# Patient Record
Sex: Male | Born: 2000 | Race: White | Hispanic: No | Marital: Single | Smoking: Never smoker
Health system: Southern US, Community
[De-identification: ages and names within clinical notes are randomized; demographics above are authoritative.]

## PROBLEM LIST (undated history)

## (undated) HISTORY — PX: APPENDECTOMY: SHX54

---
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
## 2022-05-01 ENCOUNTER — Encounter: Payer: Self-pay | Admitting: Family

## 2022-05-01 ENCOUNTER — Telehealth (INDEPENDENT_AMBULATORY_CARE_PROVIDER_SITE_OTHER): Payer: Medicaid Other | Admitting: Family

## 2022-05-01 DIAGNOSIS — R6883 Chills (without fever): Secondary | ICD-10-CM

## 2022-05-01 DIAGNOSIS — R519 Headache, unspecified: Secondary | ICD-10-CM

## 2022-05-01 DIAGNOSIS — R6889 Other general symptoms and signs: Secondary | ICD-10-CM

## 2022-05-01 DIAGNOSIS — R059 Cough, unspecified: Secondary | ICD-10-CM | POA: Diagnosis not present

## 2022-05-01 MED ORDER — OSELTAMIVIR PHOSPHATE 75 MG PO CAPS: 75.0000 mg | ORAL_CAPSULE | Freq: Two times a day (BID) | ORAL | 0 refills | Status: DC

## 2022-05-01 MED ORDER — BENZONATATE 200 MG PO CAPS: 200.0000 mg | ORAL_CAPSULE | Freq: Three times a day (TID) | ORAL | 1 refills | Status: DC | PRN

## 2022-05-01 NOTE — Progress Notes (Signed)
Virtual Visit Consent   Laurian Brim, you are scheduled for a virtual visit with a [facility] provider today. Just as with appointments in the office, your consent must be obtained to participate. Your consent will be active for this visit and any virtual visit you may have with one of our providers in the next 365 days. If you have a MyChart account, a copy of this consent can be sent to you electronically.  As this is a virtual visit, video technology does not allow for your provider to perform a traditional examination. This may limit your provider's ability to fully assess your condition. If your provider identifies any concerns that need to be evaluated in person or the need to arrange testing (such as labs, EKG, etc.), we will make arrangements to do so. Although advances in technology are sophisticated, we cannot ensure that it will always work on either your end or our end. If the connection with a video visit is poor, the visit may have to be switched to a telephone visit. With either a video or telephone visit, we are not always able to ensure that we have a secure connection.  By engaging in this virtual visit, you consent to the provision of healthcare and authorize for your insurance to be billed (if applicable) for the services provided during this visit. Depending on your insurance coverage, you may receive a charge related to this service.  I need to obtain your verbal consent now. Are you willing to proceed with your visit today? Cody Montgomery has provided verbal consent on 05/01/2022 for a virtual visit (video or telephone). Jannifer Rodney, FNP  Date: 05/01/2022 12:59 PM  Virtual Visit via Video Note   I, Jannifer Rodney, connected with  Cody Montgomery  ([id number], Sep 25, 2000) on 05/01/22 at  1:35 PM EST by a video-enabled telemedicine application and verified that I am speaking with the correct person using two identifiers.  Location: Patient: Virtual Visit Location Patient:  Home Provider: Virtual Visit Location Provider: Home Office   I discussed the limitations of evaluation and management by telemedicine and the availability of in person appointments. The patient expressed understanding and agreed to proceed.    History of Present Illness: Cody Montgomery is a 21 y.o. who identifies as a male who was assigned male at birth, and is being seen today for cough, congestion, and headache that started two days ago.  HPI: Cough This is a new problem. The current episode started in the past 7 days. The problem has been gradually worsening. The problem occurs every few minutes. The cough is Productive of sputum. Associated symptoms include chills, headaches, nasal congestion and rhinorrhea. Pertinent negatives include no ear congestion, ear pain, fever, myalgias, shortness of breath or wheezing. He has tried rest and OTC cough suppressant for the symptoms. The treatment provided mild relief.    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 For panic, Disp: 30 tablet, Rfl: 5   Vitamin D, Ergocalciferol, (DRISDOL) 1.25 MG  (50000 UNIT) CAPS capsule, Take 1 capsule (50,000 Units total) by mouth every 7 (seven) days., Disp: 13 capsule, Rfl: 3  Observations/Objective: Patient is well-developed, well-nourished in no acute distress.  Resting comfortably  at home.  Head is normocephalic, atraumatic.  No labored breathing.  Speech is clear and coherent with logical content.  Patient is alert and oriented at baseline.  Nasal congestion   Assessment and Plan: 1. Flu-like symptoms - oseltamivir (TAMIFLU) 75 MG capsule; Take 1 capsule (75 mg total) by mouth 2 (two) times daily.  Dispense: 10 capsule; Refill: 0 - benzonatate (TESSALON) 200 MG capsule; Take 1 capsule (200 mg total) by mouth 3 (three) times daily as needed.  Dispense: 30 capsule; Refill: 1  Force fluids  Rest Alternate motrin and tylenol  Droplet precautions  Follow up if symptoms worsen or do not improve   Follow Up Instructions: I discussed the assessment and treatment plan with the patient. The patient was provided an opportunity to ask questions and all were answered. The patient agreed with the plan and demonstrated an understanding of the instructions.  A copy of instructions were sent to the patient via MyChart unless otherwise noted below.    The patient was advised to call back or seek an in-person evaluation if the symptoms worsen or if the condition fails to improve as anticipated.  Time:  I spent 7 minutes with the patient via telehealth technology discussing the above problems/concerns.    Jannifer Rodney, FNP

## 2022-05-04 ENCOUNTER — Ambulatory Visit: Payer: Medicaid Other | Admitting: Family Medicine

## 2022-07-28 DIAGNOSIS — K047 Periapical abscess without sinus: Secondary | ICD-10-CM | POA: Diagnosis not present

## 2022-12-03 ENCOUNTER — Ambulatory Visit (INDEPENDENT_AMBULATORY_CARE_PROVIDER_SITE_OTHER): Payer: Medicaid Other | Admitting: Family Medicine

## 2022-12-03 ENCOUNTER — Other Ambulatory Visit: Payer: Medicaid Other

## 2022-12-03 VITALS — BP 133/72 | HR 88 | Temp 98.3°F | Ht 69.0 in | Wt 196.6 lb

## 2022-12-03 DIAGNOSIS — R000 Tachycardia, unspecified: Secondary | ICD-10-CM

## 2022-12-03 NOTE — Progress Notes (Signed)
Subjective:  Patient ID: Cody Montgomery, male    DOB: [birth year]  Age: 22 y.o. MRN: [id number]  CC: Tachycardia and Dizziness   HPI Cody Montgomery presents for palpitations daily, usually in the evening. Onset about 1 month ago. One episode. Sitting on bed, got up, everything got dark, tunnel vision. Asted a few moments. Had palpitations with that. Frequently the palpitations are accompanied by this, but they do make him feel odd. He drinks a lot of water. Works at [employer]. Drinks 9-10 bottles a day.      12/03/2022   11:49 AM 12/03/2022   11:30 AM 11/08/2021    3:29 PM  Depression screen PHQ 2/9  Decreased Interest 0 0 1  Down, Depressed, Hopeless 0 0 2  PHQ - 2 Score 0 0 3  Altered sleeping 3  3  Tired, decreased energy 1  0  Change in appetite 3  2  Feeling bad or failure about yourself  0  3  Trouble concentrating 0  0  Moving slowly or fidgety/restless 0  0  Suicidal thoughts 0  0  PHQ-9 Score 7  11  Difficult doing work/chores Somewhat difficult  Somewhat difficult    History Cody Montgomery has no past medical history on file.   He has a past surgical history that includes Appendectomy.   His family history includes Depression in his mother; Drug abuse in his father; Hypertension in his mother.He reports that he has never smoked. He has been exposed to tobacco smoke. He has never used smokeless tobacco. He reports that he does not drink alcohol and does not use drugs.    ROS Review of Systems  Constitutional:  Negative for activity change and fever.  Respiratory:  Negative for shortness of breath.   Cardiovascular:  Positive for chest pain (odd sensation only) and palpitations.  Musculoskeletal:  Negative for arthralgias.  Skin:  Negative for rash.  Psychiatric/Behavioral:  Negative for sleep disturbance.     Objective:  BP 133/72   Pulse 88   Temp 98.3 F (36.8 C)   Ht 5\' 9"  (1.753 m)   Wt 196 lb 9.6 oz (89.2 kg)   SpO2 99%   BMI 29.03  kg/m   BP Readings from Last 3 Encounters:  12/03/22 133/72  11/08/21 137/79  09/22/20 (!) 141/86 (97%, Z = 1.88 /  96%, Z = 1.75)*   *BP percentiles are based on the 2017 AAP Clinical Practice Guideline for boys    Wt Readings from Last 3 Encounters:  12/03/22 196 lb 9.6 oz (89.2 kg)  11/08/21 189 lb (85.7 kg) (89%, Z= 1.21)*  09/22/20 (!) 261 lb 9.6 oz (118.7 kg) (>99%, Z= 2.65)*   * Growth percentiles are based on CDC (Boys, 2-20 Years) data.     Physical Exam Constitutional:      General: He is not in acute distress.    Appearance: He is well-developed.  HENT:     Head: Normocephalic and atraumatic.     Right Ear: External ear normal.     Left Ear: External ear normal.     Nose: Nose normal.  Eyes:     Conjunctiva/sclera: Conjunctivae normal.     Pupils: Pupils are equal, round, and reactive to light.  Cardiovascular:     Rate and Rhythm: Normal rate and regular rhythm.     Heart sounds: Normal heart sounds. No murmur heard. Pulmonary:     Effort: Pulmonary effort is normal. No respiratory distress.  Breath sounds: Normal breath sounds. No wheezing or rales.  Abdominal:     Palpations: Abdomen is soft.     Tenderness: There is no abdominal tenderness.  Musculoskeletal:        General: Normal range of motion.     Cervical back: Normal range of motion and neck supple.  Skin:    General: Skin is warm and dry.  Neurological:     Mental Status: He is alert and oriented to person, place, and time.     Deep Tendon Reflexes: Reflexes are normal and symmetric.  Psychiatric:        Behavior: Behavior normal.        Thought Content: Thought content normal.        Judgment: Judgment normal.       Assessment & Plan:   Cody Montgomery was seen today for tachycardia and dizziness.  Diagnoses and all orders for this visit:  Tachycardia -     EKG 12-Lead -     CBC with Differential/Platelet -     CMP14+EGFR -     TSH -     LONG TERM MONITOR (3-14 DAYS);  Future       I have discontinued Cody Montgomery's Vitamin D, hydrOXYzine, Vitamin D (Ergocalciferol), oseltamivir, and benzonatate.  Allergies as of 12/03/2022       Reactions   Latex Rash   Chlorhexidine Rash        Medication List        Accurate as of December 03, 2022 12:21 PM. If you have any questions, ask your nurse or doctor.          STOP taking these medications    benzonatate 200 MG capsule Commonly known as: TESSALON Stopped by: Kalandra Masters   hydrOXYzine 25 MG tablet Commonly known as: ATARAX Stopped by: Broadus John Alessandro Griep   oseltamivir 75 MG capsule Commonly known as: Tamiflu Stopped by: Linetta Regner   Vitamin D (Ergocalciferol) 1.25 MG (50000 UNIT) Caps capsule Commonly known as: DRISDOL Stopped by: Broadus John Shigeru Lampert   Vitamin D 50 MCG (2000 UT) Caps Stopped by: Broadus John Kane Kusek         Follow-up: Return in about 3 weeks (around 12/24/2022).  Mechele Claude, M.D.

## 2022-12-24 ENCOUNTER — Ambulatory Visit (INDEPENDENT_AMBULATORY_CARE_PROVIDER_SITE_OTHER): Payer: Medicaid Other | Admitting: Family Medicine

## 2022-12-24 ENCOUNTER — Encounter: Payer: Self-pay | Admitting: Family Medicine

## 2022-12-24 VITALS — BP 113/63 | HR 68 | Temp 98.1°F | Ht 69.0 in | Wt 192.8 lb

## 2022-12-24 DIAGNOSIS — R000 Tachycardia, unspecified: Secondary | ICD-10-CM | POA: Diagnosis not present

## 2022-12-24 NOTE — Progress Notes (Signed)
Subjective:  Patient ID: Cody Montgomery, male    DOB: [birth year]  Age: 22 y.o. MRN: [id number]  CC: Follow-up   HPI Cody Montgomery presents for recheck of palpitations and tachycardia. Cody Montgomery wore the Zio. Cody Montgomery is here for report review. No chest pain, or passing out. Cody Montgomery had one episode of dizziness with HR 125 on August 1.      12/24/2022   11:28 AM 12/03/2022   11:49 AM 12/03/2022   11:30 AM  Depression screen PHQ 2/9  Decreased Interest 0 0 0  Down, Depressed, Hopeless 0 0 0  PHQ - 2 Score 0 0 0  Altered sleeping  3   Tired, decreased energy  1   Change in appetite  3   Feeling bad or failure about yourself   0   Trouble concentrating  0   Moving slowly or fidgety/restless  0   Suicidal thoughts  0   PHQ-9 Score  7   Difficult doing work/chores  Somewhat difficult     History Cody Montgomery has no past medical history on file.   Cody Montgomery has a past surgical history that includes Appendectomy.   His family history includes Depression in his mother; Drug abuse in his father; Hypertension in his mother.Cody Montgomery reports that Cody Montgomery has never smoked. Cody Montgomery has been exposed to tobacco smoke. Cody Montgomery has never used smokeless tobacco. Cody Montgomery reports that Cody Montgomery does not drink alcohol and does not use drugs.    ROS Review of Systems  Objective:  BP 113/63   Pulse 68   Temp 98.1 F (36.7 C)   Ht 5\' 9"  (1.753 m)   Wt 192 lb 12.8 oz (87.5 kg)   SpO2 97%   BMI 28.47 kg/m   BP Readings from Last 3 Encounters:  12/24/22 113/63  12/03/22 133/72  11/08/21 137/79    Wt Readings from Last 3 Encounters:  12/24/22 192 lb 12.8 oz (87.5 kg)  12/03/22 196 lb 9.6 oz (89.2 kg)  11/08/21 189 lb (85.7 kg) (89%, Z= 1.21)*   * Growth percentiles are based on CDC (Boys, 2-20 Years) data.     Physical Exam Vitals reviewed.  Constitutional:      Appearance: Cody Montgomery is well-developed.  HENT:     Head: Normocephalic and atraumatic.     Right Ear: External ear normal.     Left Ear: External ear normal.     Mouth/Throat:      Pharynx: No oropharyngeal exudate or posterior oropharyngeal erythema.  Eyes:     Pupils: Pupils are equal, round, and reactive to light.  Cardiovascular:     Rate and Rhythm: Normal rate and regular rhythm.     Heart sounds: No murmur heard. Pulmonary:     Effort: No respiratory distress.     Breath sounds: Normal breath sounds.  Musculoskeletal:     Cervical back: Normal range of motion and neck supple.  Neurological:     Mental Status: Cody Montgomery is alert and oriented to person, place, and time.       Assessment & Plan:   Cody Montgomery was seen today for follow-up.  Diagnoses and all orders for this visit:  Tachycardia    No harmful dysrhythmia noted on multiday monitor.    Cody Montgomery does not currently have medications on file.  Allergies as of 12/24/2022       Reactions   Latex Rash   Chlorhexidine Rash        Medication List    as of December 24, 2022  5:40 PM   You have not been prescribed any medications.      Follow-up: Return if symptoms worsen or fail to improve.  Mechele Claude, M.D.

## 2023-03-26 ENCOUNTER — Ambulatory Visit: Payer: Medicaid Other | Admitting: Family Medicine

## 2023-03-27 ENCOUNTER — Encounter: Payer: Self-pay | Admitting: Family Medicine

## 2023-06-03 ENCOUNTER — Encounter: Payer: Self-pay | Admitting: Family Medicine

## 2023-06-04 ENCOUNTER — Telehealth: Payer: Self-pay | Admitting: Physician Assistant

## 2023-06-04 DIAGNOSIS — J069 Acute upper respiratory infection, unspecified: Secondary | ICD-10-CM

## 2023-06-04 MED ORDER — FLUTICASONE PROPIONATE 50 MCG/ACT NA SUSP
2.0000 | Freq: Every day | NASAL | 0 refills | Status: AC
Start: 2023-06-04 — End: ?

## 2023-06-04 MED ORDER — BENZONATATE 100 MG PO CAPS
100.0000 mg | ORAL_CAPSULE | Freq: Three times a day (TID) | ORAL | 0 refills | Status: AC | PRN
Start: 2023-06-04 — End: ?

## 2023-06-04 NOTE — Progress Notes (Signed)
I have spent 5 minutes in review of e-visit questionnaire, review and updating patient chart, medical decision making and response to patient.   Piedad Climes, PA-C

## 2023-06-04 NOTE — Progress Notes (Signed)
